# Patient Record
Sex: Female | Born: 1970 | Hispanic: Yes | Smoking: Never smoker
Health system: Southern US, Community
[De-identification: ages and names within clinical notes are randomized; demographics above are authoritative.]

## PROBLEM LIST (undated history)

## (undated) DIAGNOSIS — R51 Headache: Secondary | ICD-10-CM

## (undated) HISTORY — PX: BREAST BIOPSY: SHX20

## (undated) HISTORY — PX: NO PAST SURGERIES: SHX2092

---

## 2011-05-29 ENCOUNTER — Other Ambulatory Visit (HOSPITAL_COMMUNITY): Payer: Self-pay | Admitting: Family Medicine

## 2011-05-29 DIAGNOSIS — Z139 Encounter for screening, unspecified: Secondary | ICD-10-CM

## 2011-06-04 ENCOUNTER — Ambulatory Visit (HOSPITAL_COMMUNITY)
Admission: RE | Admit: 2011-06-04 | Discharge: 2011-06-04 | Disposition: A | Discharge status: Home / Self Care | Payer: Self-pay | Source: Ambulatory Visit | Attending: Family Medicine | Admitting: Family Medicine

## 2011-06-04 DIAGNOSIS — Z139 Encounter for screening, unspecified: Secondary | ICD-10-CM

## 2012-04-21 ENCOUNTER — Other Ambulatory Visit (HOSPITAL_COMMUNITY): Payer: Self-pay | Admitting: Nurse Practitioner

## 2012-04-21 DIAGNOSIS — N63 Unspecified lump in unspecified breast: Secondary | ICD-10-CM

## 2012-04-21 DIAGNOSIS — Z139 Encounter for screening, unspecified: Secondary | ICD-10-CM

## 2012-04-22 ENCOUNTER — Ambulatory Visit (HOSPITAL_COMMUNITY)
Admission: RE | Admit: 2012-04-22 | Discharge: 2012-04-22 | Disposition: A | Discharge status: Home / Self Care | Payer: PRIVATE HEALTH INSURANCE | Source: Ambulatory Visit | Attending: General Surgery | Admitting: General Surgery

## 2012-04-22 ENCOUNTER — Other Ambulatory Visit (HOSPITAL_COMMUNITY): Payer: Self-pay | Admitting: General Surgery

## 2012-04-22 DIAGNOSIS — N644 Mastodynia: Secondary | ICD-10-CM | POA: Insufficient documentation

## 2012-04-22 DIAGNOSIS — L02419 Cutaneous abscess of limb, unspecified: Secondary | ICD-10-CM

## 2012-04-22 DIAGNOSIS — R599 Enlarged lymph nodes, unspecified: Secondary | ICD-10-CM | POA: Insufficient documentation

## 2012-04-22 DIAGNOSIS — IMO0002 Reserved for concepts with insufficient information to code with codable children: Secondary | ICD-10-CM | POA: Insufficient documentation

## 2012-04-24 NOTE — Patient Instructions (Addendum)
20 Katherine Jensen  04/24/2012   Your procedure is scheduled on:  04/28/2012   Report to [facility] at  930  AM.  Call this number if you have problems the morning of surgery: [phone]   Remember:   Do not eat food:After Midnight.  May have clear liquids:until Midnight .    Take these medicines the morning of surgery with A SIP OF WATER: none   Do not wear jewelry, make-up or nail polish.  Do not wear lotions, powders, or perfumes. You may wear deodorant.  Do not shave 48 hours prior to surgery. Men may shave face and neck.  Do not bring valuables to the hospital.  Contacts, dentures or bridgework may not be worn into surgery.  Leave suitcase in the car. After surgery it may be brought to your room.  For patients admitted to the hospital, checkout time is 11:00 AM the day of discharge.   Patients discharged the day of surgery will not be allowed to drive home.  Name and phone number of your driver: family  Special Instructions: CHG Shower Use Special Wash: 1/2 bottle night before surgery and 1/2 bottle morning of surgery.   Please read over the following fact sheets that you were given: Pain Booklet, MRSA Information, Surgical Site Infection Prevention, Anesthesia Post-op Instructions and Care and Recovery After Surgery Swollen Lymph Nodes The lymphatic system filters fluid from around cells. It is like a system of blood vessels. These channels carry lymph instead of blood. The lymphatic system is an important part of the immune (disease fighting) system. When people talk about "swollen glands in the neck," they are usually talking about swollen lymph nodes. The lymph nodes are like the little traps for infection. You and your caregiver may be able to feel lymph nodes, especially swollen nodes, in these common areas: the groin (inguinal area), armpits (axilla), and above the clavicle (supraclavicular). You may also feel them in the neck (cervical) and the back of the head just above the  hairline (occipital). Swollen glands occur when there is any condition in which the body responds with an allergic type of reaction. For instance, the glands in the neck can become swollen from insect bites or any type of minor infection on the head. These are very noticeable in children with only minor problems. Lymph nodes may also become swollen when there is a tumor or problem with the lymphatic system, such as Hodgkin's disease. TREATMENT   Most swollen glands do not require treatment. They can be observed (watched) for a short period of time, if your caregiver feels it is necessary. Most of the time, observation is not necessary.   Antibiotics (medicines that kill germs) may be prescribed by your caregiver. Your caregiver may prescribe these if he or she feels the swollen glands are due to a bacterial (germ) infection. Antibiotics are not used if the swollen glands are caused by a virus.  HOME CARE INSTRUCTIONS   Take medications as directed by your caregiver. Only take over-the-counter or prescription medicines for pain, discomfort, or fever as directed by your caregiver.  SEEK MEDICAL CARE IF:   If you begin to run a temperature greater than 102 F (38.9 C), or as your caregiver suggests.  MAKE SURE YOU:   Understand these instructions.   Will watch your condition.   Will get help right away if you are not doing well or get worse.  Document Released: 10/12/2002 Document Revised: 10/11/2011 Document Reviewed: 10/22/2005 ExitCare Patient Information  [address], [place].PATIENT INSTRUCTIONS POST-ANESTHESIA  IMMEDIATELY FOLLOWING SURGERY:  Do not drive or operate machinery for the first twenty four hours after surgery.  Do not make any important decisions for twenty four hours after surgery or while taking narcotic pain medications or sedatives.  If you develop intractable nausea and vomiting or a severe headache please notify your doctor immediately.  FOLLOW-UP:  Please make an  appointment with your surgeon as instructed. You do not need to follow up with anesthesia unless specifically instructed to do so.  WOUND CARE INSTRUCTIONS (if applicable):  Keep a dry clean dressing on the anesthesia/puncture wound site if there is drainage.  Once the wound has quit draining you may leave it open to air.  Generally you should leave the bandage intact for twenty four hours unless there is drainage.  If the epidural site drains for more than 36-48 hours please call the anesthesia department.  QUESTIONS?:  Please feel free to call your physician or the hospital operator if you have any questions, and they will be happy to assist you.   Ganglios linfticos inflamados (Swollen Lymph Nodes) El sistema linftico filtra los lquidos que rodean a las clulas. Es similar al sistema de vasos sanguneos. Estos canales transportan linfa en vez de sangre. El sistema linftico es una parte importante del sistema inmunitario (el que lucha contra las enfermedades). Cuando las personas hablan de "glndulas inflamadas en el cuello" generalmente se refieren a la inflamacin de los ganglios linfticos. Los ganglios linfticos son como pequeas trampas para las infecciones. Usted y Mining engineer que lo asiste pueden palpar los ganglios linfticos, especialmente los que estn inflamados, en estas zonas: en la ingle, en la axila y en la zona que se encuentra por encima de la clavcula. Tambin podr palparlos en el cuello (zona cervical) y en la parte posterior de la cabeza, justo por encima de la lnea del cuero cabelludo (zona occipital). Los ganglios linfticos inflamados aparecen cuando hay alguna enfermedad en la que el organismo responde con cierto tipo de Automotive engineer. Por ejemplo, los ganglios del cuello pueden inflamarse por la picadura de un insecto o por cualquier infeccin menor en la cabeza. Estos son muy fciles de advertir en los nios que sufren un trastorno leve. Los ganglios linfticos  tambin se inflaman cuando hay un tumor o un problema con el sistema linftico, como en la enfermedad de Hodgkin. TRATAMIENTO  La mayor parte de los ganglios inflamados no requieren TEFL teacher. Tendrn que observarse durante un breve perodo, si el profesional lo considera necesario. En general, no es necesaria la observacin.   El profesional que lo asiste podr prescribirle antibiticos (medicamentos que destruyen grmenes). El profesional podr prescribirlos si considera que la inflamacin de los ganglios se debe a una infeccin bacteriana (por grmenes). Los antibiticos no se utilizan si la causa de la inflamacin es un virus.  INSTRUCCIONES PARA EL CUIDADO DOMICILIARIO  Tome los [employer] indic el profesional que lo asiste. Utilice los medicamentos de venta libre o de prescripcin para Chief Technology Officer, Environmental health practitioner o la [place], segn se lo indique el profesional que lo asiste.  SOLICITE ATENCIN MDICA SI:  La temperatura se eleva sin motivo por encima de 102 F (38.9 C) o segn le indique el profesional que lo asiste.  EST SEGURO QUE:   Comprende las instrucciones para el alta mdica.   Controlar su enfermedad.   Solicitar atencin mdica de inmediato segn las indicaciones.  Document Released: 08/01/2005 Document Revised: 10/11/2011 ExitCare Patient Information  [address]        re, [place].Instrucciones a seguir luego de la anestesia general en los adultos (Instructions Following General Anesthetic, Adult) Usted fue sometido a anestesia general. Un anestesista (un enfermero especializado en administrar anestesia) o un anestesilogo (un mdico especializado en administrar anestesia) lo ha inducido a dormir con Designer, multimedia un procedimiento. Durante las 24 horas siguientes al procedimiento podr sentir:  Research scientist (life sciences).   Debilidad.   Somnolencia.  DESPUS DE LA CIRUGA Despus de la [place], lo llevarn a una sala de recuperacin. Un  enfermero controlar su evolucin. Una vez que despierte, se encuentre estabilizado y pueda ingerir lquidos, usted podr volver a su hogar excepto que ocurra un imprevisto. La informacin que sigue es vlida para las primeras 24 horas posteriores a Higher education careers adviser.  No conduzca. Si est solo, no tome transportes pblicos.   No beba alcohol.   No tome medicamentos que no le haya autorizado el profesional que lo asiste.   No firme documentos importantes ni tome decisiones trascendentes.   Es importante que una persona responsable lo acompae durante las primeras 24 horas luego de la anestesia.   Puede reanudar su dieta y sus actividades normales segn se le haya indicado.   Utilice los medicamentos de venta libre o de prescripcin para Chief Technology Officer, Environmental health practitioner o la [place], segn se lo indique el profesional que lo asiste.  Si tiene preguntas o se le presenta algn problema relacionado con la anestesia, comunquese con el hospital y pida por el anestesista o anestesilogo de [place]. SOLICITE ATENCIN MDICA DE INMEDIATO SI:  Aparece una erupcin cutnea.   Presenta dificultad para respirar.   Siente dolor en el pecho.   Presenta algn problema de alergia.  Document Released: 10/22/2005 Document Revised: 10/11/2011 [facility] Patient Information 2012 [place], [place].

## 2012-04-25 ENCOUNTER — Encounter (HOSPITAL_COMMUNITY)
Admission: RE | Admit: 2012-04-25 | Discharge: 2012-04-25 | Disposition: A | Discharge status: Home / Self Care | Payer: Self-pay | Source: Ambulatory Visit | Attending: General Surgery | Admitting: General Surgery

## 2012-04-25 ENCOUNTER — Encounter (HOSPITAL_COMMUNITY): Payer: Self-pay | Admitting: Pharmacy Technician

## 2012-04-25 ENCOUNTER — Encounter (HOSPITAL_COMMUNITY): Payer: Self-pay

## 2012-04-25 HISTORY — DX: Headache: R51

## 2012-04-25 LAB — BASIC METABOLIC PANEL
CO2: 30 mEq/L (ref 19–32)
Chloride: 100 mEq/L (ref 96–112)
Sodium: 137 mEq/L (ref 135–145)

## 2012-04-25 LAB — SURGICAL PCR SCREEN: Staphylococcus aureus: NEGATIVE

## 2012-04-25 LAB — HCG, SERUM, QUALITATIVE: Preg, Serum: NEGATIVE

## 2012-04-28 ENCOUNTER — Encounter (HOSPITAL_COMMUNITY): Payer: Self-pay | Admitting: Anesthesiology

## 2012-04-28 ENCOUNTER — Encounter (HOSPITAL_COMMUNITY)
Admission: RE | Disposition: A | Discharge status: Home / Self Care | Payer: Self-pay | Source: Ambulatory Visit | Attending: General Surgery

## 2012-04-28 ENCOUNTER — Ambulatory Visit (HOSPITAL_COMMUNITY): Payer: PRIVATE HEALTH INSURANCE | Admitting: Anesthesiology

## 2012-04-28 ENCOUNTER — Ambulatory Visit (HOSPITAL_COMMUNITY)
Admission: RE | Admit: 2012-04-28 | Discharge: 2012-04-28 | Disposition: A | Discharge status: Home / Self Care | Payer: PRIVATE HEALTH INSURANCE | Source: Ambulatory Visit | Attending: General Surgery | Admitting: General Surgery

## 2012-04-28 DIAGNOSIS — R599 Enlarged lymph nodes, unspecified: Secondary | ICD-10-CM

## 2012-04-28 DIAGNOSIS — I889 Nonspecific lymphadenitis, unspecified: Secondary | ICD-10-CM | POA: Insufficient documentation

## 2012-04-28 HISTORY — PX: MASS EXCISION: SHX2000

## 2012-04-28 SURGERY — AXILLARY LYMPH NODE BIOPSY
Anesthesia: General | Site: Axilla | Laterality: Left | Wound class: Clean

## 2012-04-28 MED ORDER — CEFAZOLIN SODIUM 1-5 GM-% IV SOLN
1.0000 g | INTRAVENOUS | Status: AC
  Administered 2012-04-28: 1 g via INTRAVENOUS

## 2012-04-28 MED ORDER — LIDOCAINE HCL (PF) 1 % IJ SOLN
INTRAMUSCULAR | Status: AC
  Filled 2012-04-28: qty 5

## 2012-04-28 MED ORDER — PROPOFOL 10 MG/ML IV EMUL
INTRAVENOUS | Status: AC
  Filled 2012-04-28: qty 20

## 2012-04-28 MED ORDER — MIDAZOLAM HCL 2 MG/2ML IJ SOLN
1.0000 mg | INTRAMUSCULAR | Status: DC | PRN
  Administered 2012-04-28: 2 mg via INTRAVENOUS

## 2012-04-28 MED ORDER — STERILE WATER FOR IRRIGATION IR SOLN
Status: DC | PRN
  Administered 2012-04-28: 2000 mL

## 2012-04-28 MED ORDER — MIDAZOLAM HCL 2 MG/2ML IJ SOLN
INTRAMUSCULAR | Status: AC
  Administered 2012-04-28: 2 mg via INTRAVENOUS
  Filled 2012-04-28: qty 2

## 2012-04-28 MED ORDER — FENTANYL CITRATE 0.05 MG/ML IJ SOLN
INTRAMUSCULAR | Status: AC
  Administered 2012-04-28: 50 ug via INTRAVENOUS
  Filled 2012-04-28: qty 2

## 2012-04-28 MED ORDER — BACITRACIN ZINC 500 UNIT/GM EX OINT
TOPICAL_OINTMENT | CUTANEOUS | Status: AC
  Filled 2012-04-28: qty 1.8

## 2012-04-28 MED ORDER — EPHEDRINE SULFATE 50 MG/ML IJ SOLN
INTRAMUSCULAR | Status: AC
  Filled 2012-04-28: qty 1

## 2012-04-28 MED ORDER — OXYCODONE-ACETAMINOPHEN 5-325 MG PO TABS: 1.0000 | ORAL_TABLET | ORAL | Status: AC | PRN

## 2012-04-28 MED ORDER — CEFAZOLIN SODIUM 1-5 GM-% IV SOLN
INTRAVENOUS | Status: AC
  Filled 2012-04-28: qty 50

## 2012-04-28 MED ORDER — HEMOSTATIC AGENTS (NO CHARGE) OPTIME
TOPICAL | Status: DC | PRN
  Administered 2012-04-28: 1 via TOPICAL

## 2012-04-28 MED ORDER — PROPOFOL 10 MG/ML IV BOLUS
INTRAVENOUS | Status: DC | PRN
  Administered 2012-04-28: 30 mg via INTRAVENOUS
  Administered 2012-04-28: 120 mg via INTRAVENOUS

## 2012-04-28 MED ORDER — STERILE WATER FOR IRRIGATION IR SOLN
Status: DC | PRN
  Administered 2012-04-28: 1000 mL

## 2012-04-28 MED ORDER — ONDANSETRON HCL 4 MG/2ML IJ SOLN: 4.0000 mg | Freq: Once | INTRAMUSCULAR | Status: DC | PRN

## 2012-04-28 MED ORDER — FENTANYL CITRATE 0.05 MG/ML IJ SOLN
INTRAMUSCULAR | Status: DC | PRN
  Administered 2012-04-28: 25 ug via INTRAVENOUS
  Administered 2012-04-28: 50 ug via INTRAVENOUS
  Administered 2012-04-28: 25 ug via INTRAVENOUS
  Administered 2012-04-28: 50 ug via INTRAVENOUS

## 2012-04-28 MED ORDER — LIDOCAINE HCL 1 % IJ SOLN
INTRAMUSCULAR | Status: DC | PRN
  Administered 2012-04-28: 30 mg via INTRADERMAL

## 2012-04-28 MED ORDER — LACTATED RINGERS IV SOLN
INTRAVENOUS | Status: DC
  Administered 2012-04-28: 11:00:00 via INTRAVENOUS

## 2012-04-28 MED ORDER — EPHEDRINE SULFATE 50 MG/ML IJ SOLN
INTRAMUSCULAR | Status: DC | PRN
  Administered 2012-04-28 (×2): 5 mg via INTRAVENOUS

## 2012-04-28 MED ORDER — FENTANYL CITRATE 0.05 MG/ML IJ SOLN
INTRAMUSCULAR | Status: AC
  Filled 2012-04-28: qty 2

## 2012-04-28 MED ORDER — FENTANYL CITRATE 0.05 MG/ML IJ SOLN
25.0000 ug | INTRAMUSCULAR | Status: DC | PRN
  Administered 2012-04-28: 50 ug via INTRAVENOUS

## 2012-04-28 SURGICAL SUPPLY — 52 items
APPLIER CLIP 11 MED OPEN (CLIP)
APPLIER CLIP 9.375 SM OPEN (CLIP) ×4
ATTRACTOMAT 16X20 MAGNETIC DRP (DRAPES) ×2 IMPLANT
BAG HAMPER (MISCELLANEOUS) ×2 IMPLANT
BLADE SURG SZ10 CARB STEEL (BLADE) ×2 IMPLANT
BNDG CONFORM 6X.82 1P STRL (GAUZE/BANDAGES/DRESSINGS) IMPLANT
CLIP APPLIE 11 MED OPEN (CLIP) IMPLANT
CLIP APPLIE 9.375 SM OPEN (CLIP) ×2 IMPLANT
CLOTH BEACON ORANGE TIMEOUT ST (SAFETY) ×2 IMPLANT
COVER LIGHT HANDLE STERIS (MISCELLANEOUS) ×4 IMPLANT
DRAPE PROXIMA HALF (DRAPES) ×2 IMPLANT
ELECT REM PT RETURN 9FT ADLT (ELECTROSURGICAL) ×2
ELECTRODE REM PT RTRN 9FT ADLT (ELECTROSURGICAL) ×1 IMPLANT
EVACUATOR DRAINAGE 10X20 100CC (DRAIN) IMPLANT
EVACUATOR SILICONE 100CC (DRAIN)
FORMALIN 10 PREFIL 480ML (MISCELLANEOUS) IMPLANT
GAUZE PACKING IODOFORM 1 (PACKING) ×2 IMPLANT
GLOVE BIOGEL PI IND STRL 7.0 (GLOVE) ×2 IMPLANT
GLOVE BIOGEL PI INDICATOR 7.0 (GLOVE) ×2
GLOVE ECLIPSE 6.5 STRL STRAW (GLOVE) ×2 IMPLANT
GLOVE ECLIPSE 7.0 STRL STRAW (GLOVE) ×2 IMPLANT
GLOVE EXAM NITRILE MD LF STRL (GLOVE) ×4 IMPLANT
GLOVE SKINSENSE NS SZ7.0 (GLOVE) ×1
GLOVE SKINSENSE STRL SZ7.0 (GLOVE) ×1 IMPLANT
GOWN STRL REIN XL XLG (GOWN DISPOSABLE) ×6 IMPLANT
INST SET MINOR GENERAL (KITS) ×2 IMPLANT
KIT ROOM TURNOVER APOR (KITS) ×2 IMPLANT
MANIFOLD NEPTUNE II (INSTRUMENTS) ×2 IMPLANT
MARKER SKIN DUAL TIP RULER LAB (MISCELLANEOUS) ×2 IMPLANT
PACK MINOR (CUSTOM PROCEDURE TRAY) ×2 IMPLANT
PAD ABD 5X9 TENDERSORB (GAUZE/BANDAGES/DRESSINGS) ×2 IMPLANT
PAD ARMBOARD 7.5X6 YLW CONV (MISCELLANEOUS) ×2 IMPLANT
SET BASIN LINEN APH (SET/KITS/TRAYS/PACK) ×2 IMPLANT
SOL PREP PROV IODINE SCRUB 4OZ (MISCELLANEOUS) ×2 IMPLANT
SPONGE GAUZE 4X4 12PLY (GAUZE/BANDAGES/DRESSINGS) ×2 IMPLANT
SPONGE INTESTINAL PEANUT (DISPOSABLE) ×4 IMPLANT
SPONGE LAP 18X18 X RAY DECT (DISPOSABLE) ×2 IMPLANT
STAPLER VISISTAT 35W (STAPLE) ×2 IMPLANT
STOCKINETTE IMPERVIOUS LG (DRAPES) ×2 IMPLANT
SUT ETHILON 3 0 FSL (SUTURE) ×4 IMPLANT
SUT SILK 2 0 (SUTURE) ×1
SUT SILK 2-0 18XBRD TIE 12 (SUTURE) ×1 IMPLANT
SUT VIC AB 3-0 SH 27 (SUTURE) ×2
SUT VIC AB 3-0 SH 27X BRD (SUTURE) ×2 IMPLANT
SUT VIC AB 5-0 P-3 18X BRD (SUTURE) IMPLANT
SUT VIC AB 5-0 P3 18 (SUTURE)
SUT VICRYL AB 3 0 TIES (SUTURE) ×2 IMPLANT
SWAB CULTURE LIQ STUART DBL (MISCELLANEOUS) ×2 IMPLANT
SYR BULB IRRIGATION 50ML (SYRINGE) ×4 IMPLANT
TAPE CLOTH SURG 4X10 WHT LF (GAUZE/BANDAGES/DRESSINGS) ×2 IMPLANT
TUBE ANAEROBIC PORT A CUL  W/M (MISCELLANEOUS) ×2 IMPLANT
WATER STERILE IRR 1000ML POUR (IV SOLUTION) ×6 IMPLANT

## 2012-04-28 NOTE — Anesthesia Postprocedure Evaluation (Addendum)
  Anesthesia Post-op Note  Patient: Katherine Jensen  Procedure(s) Performed: Procedure(s) (LRB): AXILLARY LYMPH NODE BIOPSY (Left) EXCISION MASS (Left)  Patient Location: PACU  Anesthesia Type: General  Level of Consciousness: sedated  Airway and Oxygen Therapy: Patient Spontanous Breathing and Patient connected to face mask oxygen  Post-op Pain: none  Post-op Assessment: Post-op Vital signs reviewed, Patient's Cardiovascular Status Stable, Respiratory Function Stable, Patent Airway and No signs of Nausea or vomiting  Post-op Vital Signs: Reviewed and stable  Complications: No apparent anesthesia complications

## 2012-04-28 NOTE — Anesthesia Preprocedure Evaluation (Addendum)
Anesthesia Evaluation  Patient identified by MRN, date of birth, ID band Patient awake    Reviewed: Allergy & Precautions, H&P , NPO status   Airway   Neck ROM: Full    Dental  (+) Partial Upper   Pulmonary neg pulmonary ROS,  breath sounds clear to auscultation        Cardiovascular negative cardio ROS  Rhythm:Regular     Neuro/Psych negative neurological ROS     GI/Hepatic   Endo/Other    Renal/GU      Musculoskeletal   Abdominal   Peds  Hematology   Anesthesia Other Findings   Reproductive/Obstetrics                           Anesthesia Physical Anesthesia Plan  ASA: I  Anesthesia Plan: General   Post-op Pain Management:    Induction: Intravenous  Airway Management Planned: LMA  Additional Equipment:   Intra-op Plan:   Post-operative Plan: Extubation in OR  Informed Consent: I have reviewed the patients History and Physical, chart, labs and discussed the procedure including the risks, benefits and alternatives for the proposed anesthesia with the patient or authorized representative who has indicated his/her understanding and acceptance.     Plan Discussed with:   Anesthesia Plan Comments:         Anesthesia Quick Evaluation

## 2012-04-28 NOTE — Discharge Instructions (Signed)
Breast Biopsy Care After These instructions give you information on caring for yourself after your procedure. Your doctor may also give you more specific instructions. Call your doctor if you have any problems or questions after your procedure. HOME CARE  Change any bandages (dressings) as told by your doctor.   Only take sponge baths during the first 2 to 3 days. Keep your bandage dry.   Wear a bra all day and night until you see your doctor again.   Eat a healthy diet.   Do not exercise, drive, lift, or do activities without your doctor's permission.   Only take medicine as told by your doctor. Do not take aspirin. Do not drink alcohol while taking pain medicine.   Protect the biopsy area. Do not let the area get bumped.   Keep all follow-up appointments.  Finding out the results of your test Ask when your test results will be ready. Make sure you get your test results. GET HELP RIGHT AWAY IF:   You have trouble breathing.   You have redness and puffiness (swelling) around the biopsy site.   You notice a bad smell coming from the biopsy site.   You are bleeding more from your biopsy site.   You have yellowish white fluid (pus) coming from the biopsy site.   The biopsy site is opening.   You get a rash.   You need stronger pain medicine.   You think you are reacting badly to your medicine.   You have a fever.  MAKE SURE YOU:  Understand these instructions.   Will watch your condition.   Will get help right away if you are not doing well or get worse.  Document Released: 08/18/2009 Document Revised: 10/11/2011 Document Reviewed: 08/18/2009 ExitCare Patient Information 2012 [employer]. 

## 2012-04-28 NOTE — Progress Notes (Signed)
41 yr old [place]. Female with tender node in Left axilla for biopsy.  Mammogram neg.  Clinically no findings suggestive of abscess other than pain and mildly elevated  White count.  Sonogram shows enlarged lymph nodes.  She states she has had no rashes or fatigue or wt loss.  She may have had some temp but this has never been recorded.  Have discussed with path and we will deliver specimen in saline soaked gauze.  Labs reviewed and procedure and risks explained in Spanish.  Operative site marked  No clinical change in H&P.

## 2012-04-28 NOTE — Op Note (Signed)
Katherine Jensen, Katherine Jensen                 ACCOUNT NO.:  [id number]  MEDICAL RECORD NO.:  [id number]  LOCATION:  [facility]                          FACILITY:  [facility]  PHYSICIAN:  Barbaraann Barthel, M.D. DATE OF BIRTH:  Aug 26, 1971  DATE OF PROCEDURE:  04/28/2012 DATE OF DISCHARGE:                              OPERATIVE REPORT   PREOPERATIVE DIAGNOSIS:  Enlarged axillary lymph node.  POSTOPERATIVE DIAGNOSIS:  Enlarged axillary lymph node.  SURGEON:  Barbaraann Barthel, MD  SPECIMEN:  Left axillary lymph node.  WOUND CLASSIFICATION:  Clean contaminated (possible abscess versus necrotic lymph node).  Final pathology pending.  NOTE:  This is a 41 year old [place] female who had an enlarged left axillary lymph node.  Her breast exam was negative.  This year's mammogram was also negative.  We did not find any other findings on her head and neck exam.  Her clinical symptoms, she states she may have had some temperature; however, each time we took her temperature, it was no higher than 98.0 essentially.  She had no history of any Pel-Ebstein type of fevers, although she had considerable pain in her left axilla. Examining her left axilla, she did not have any erythema.  Other than pain, she had no obvious signs of any abscess.  The sonogram revealed an enlarged lymph nodes in the left axilla and no fluid collection suggesting abscess.  GROSS OPERATIVE FINDINGS:  There appeared to be there some necrotic tissue versus pus.  I did not smell any fraction.  Cultures were obtained for aerobic and anaerobic and acid-fast cultures.  The lymph node was sent to Pathology in saline for examination.  Final pathology is pending.  TECHNIQUE:  The patient was placed was in the supine position, slightly rolled to the right side of the bed.  Her underarm was prepped with Betadine solution and draped in the usual manner.  There was a very hard lymph node approximately 3 cm in diameter.  This was marked with  a sterile marking pen and then an incision was carried out over this area and we dissected this free.  There were small vessels which were clipped with surgical clips and larger vessels were tied off with a 3-0 monofilament suture.  We then irrigated with normal saline solution and sent this for frozen section which is still pending at the time of this dictation.  The wound was irrigated copiously.  We then laid a piece of Surgicel over the bed of this axilla and then packed the wound open with a saline soaked iodoform gauze which was cut to about approximately 0.25 inch in diameter.  This staples were loosely approximated the wound with the packing in between them.  Sterile dressing was applied.  Prior to closure, all sponge, needle, and instrument counts were found to be correct.  Estimated blood loss was minimal.  The patient tolerated the procedure well and was taken to the recovery room in satisfactory condition.     Barbaraann Barthel, M.D.     WB/MEDQ  D:  04/28/2012  T:  04/28/2012  Job:  [id number]

## 2012-04-28 NOTE — Progress Notes (Signed)
Post OP Check  Wound clean and dry.  Min. Discomfort.  Final path pending.  Discharge and follow up explained in Spanish.  Filed Vitals:   04/28/12 1215  BP: 106/61  Pulse:   Temp:   Resp: 14  HR 67, temp 97.8,

## 2012-04-28 NOTE — Brief Op Note (Signed)
04/28/2012  12:11 PM  PATIENT:  Katherine Jensen  41 y.o. female  PRE-OPERATIVE DIAGNOSIS:  enlarged left axillary lymph node  POST-OPERATIVE DIAGNOSIS:  hypertrophied left axillary lymph node, final pathology pending  PROCEDURE:  Procedure(s) (LRB): AXILLARY LYMPH NODE BIOPSY (Left) EXCISION MASS (Left)  SURGEON:  Surgeon(s) and Role:    * Marlane Hatcher, MD - Primary  PHYSICIAN ASSISTANT:   ASSISTANTS: none   ANESTHESIA:   general  EBL:  Total I/O In: 300 [I.V.:300] Out: 20 [Blood:20]  BLOOD ADMINISTERED:none  DRAINS: none   LOCAL MEDICATIONS USED:  NONE  SPECIMEN:  Source of Specimen:  left axillary node, and cultures taken  DISPOSITION OF SPECIMEN:  PATHOLOGY  COUNTS:  YES  TOURNIQUET:  * No tourniquets in log *  DICTATION: .Other Dictation: Dictation Number OR dict.# [id number].  PLAN OF CARE: Discharge to home after PACU  PATIENT DISPOSITION:  PACU - hemodynamically stable.   Delay start of Pharmacological VTE agent (>24hrs) due to surgical blood loss or risk of bleeding: not applicable

## 2012-04-28 NOTE — Transfer of Care (Addendum)
Immediate Anesthesia Transfer of Care Note  Patient: Katherine Jensen  Procedure(s) Performed: Procedure(s) (LRB): AXILLARY LYMPH NODE BIOPSY (Left) EXCISION MASS (Left)  Patient Location: PACU  Anesthesia Type: General  Level of Consciousness: sedated  Airway & Oxygen Therapy: Patient Spontanous Breathing and Patient connected to face mask oxygen  Post-op Assessment: Report given to PACU RN  Post vital signs: Reviewed and stable  Complications: No apparent anesthesia complications

## 2012-04-28 NOTE — Addendum Note (Signed)
Addendum  created 04/28/12 1308 by Moshe Salisbury, CRNA   Modules edited:Charges VN

## 2012-04-28 NOTE — Anesthesia Procedure Notes (Signed)
Procedure Name: LMA Insertion Date/Time: 04/28/2012 10:54 AM Performed by: Glynn Octave E Pre-anesthesia Checklist: Patient identified, Patient being monitored, Emergency Drugs available, Timeout performed and Suction available Patient Re-evaluated:Patient Re-evaluated prior to inductionOxygen Delivery Method: Circle System Utilized Preoxygenation: Pre-oxygenation with 100% oxygen Intubation Type: IV induction Ventilation: Mask ventilation without difficulty LMA: LMA inserted LMA Size: 4.0 and 3.0 Number of attempts: 1 Placement Confirmation: positive ETCO2 and breath sounds checked- equal and bilateral

## 2012-04-29 NOTE — Addendum Note (Signed)
Addendum  created 04/29/12 1109 by Lillyann Ahart J Haifa Hatton, CRNA   Modules edited:Notes Section    

## 2012-04-29 NOTE — Anesthesia Postprocedure Evaluation (Signed)
  Anesthesia Post-op Note  Patient: Katherine Jensen  Procedure(s) Performed: Procedure(s) (LRB): AXILLARY LYMPH NODE BIOPSY (Left) EXCISION MASS (Left)  Patient Location: room 3a  Anesthesia Type: General  Level of Consciousness: awake, alert , oriented and patient cooperative  Airway and Oxygen Therapy: Patient Spontanous Breathing  Post-op Pain: none  Post-op Assessment: Post-op Vital signs reviewed, Patient's Cardiovascular Status Stable, Respiratory Function Stable, Patent Airway, No signs of Nausea or vomiting and Pain level controlled  Post-op Vital Signs: Reviewed and stable  Complications: No apparent anesthesia complications

## 2012-04-30 ENCOUNTER — Encounter (HOSPITAL_COMMUNITY): Payer: Self-pay | Admitting: General Surgery

## 2012-05-01 LAB — WOUND CULTURE

## 2012-05-03 LAB — ANAEROBIC CULTURE

## 2012-05-07 ENCOUNTER — Encounter (HOSPITAL_COMMUNITY): Payer: Self-pay

## 2012-05-26 LAB — FUNGUS CULTURE W SMEAR

## 2012-06-11 LAB — AFB CULTURE WITH SMEAR (NOT AT ARMC): Acid Fast Smear: NONE SEEN

## 2012-06-30 ENCOUNTER — Ambulatory Visit (HOSPITAL_COMMUNITY): Payer: PRIVATE HEALTH INSURANCE

## 2012-07-14 ENCOUNTER — Ambulatory Visit (HOSPITAL_COMMUNITY)
Admission: RE | Admit: 2012-07-14 | Discharge: 2012-07-14 | Disposition: A | Discharge status: Home / Self Care | Payer: PRIVATE HEALTH INSURANCE | Source: Ambulatory Visit | Attending: Nurse Practitioner | Admitting: Nurse Practitioner

## 2012-07-14 DIAGNOSIS — Z139 Encounter for screening, unspecified: Secondary | ICD-10-CM

## 2012-07-14 DIAGNOSIS — Z1231 Encounter for screening mammogram for malignant neoplasm of breast: Secondary | ICD-10-CM | POA: Insufficient documentation

## 2013-07-17 ENCOUNTER — Other Ambulatory Visit (HOSPITAL_COMMUNITY): Payer: Self-pay | Admitting: Nurse Practitioner

## 2013-07-17 DIAGNOSIS — Z139 Encounter for screening, unspecified: Secondary | ICD-10-CM

## 2013-07-24 ENCOUNTER — Ambulatory Visit (HOSPITAL_COMMUNITY): Payer: Self-pay

## 2016-11-26 ENCOUNTER — Other Ambulatory Visit (HOSPITAL_COMMUNITY): Payer: Self-pay | Admitting: Nurse Practitioner

## 2016-11-26 DIAGNOSIS — Z1231 Encounter for screening mammogram for malignant neoplasm of breast: Secondary | ICD-10-CM

## 2016-11-27 ENCOUNTER — Other Ambulatory Visit (HOSPITAL_COMMUNITY): Payer: Self-pay | Admitting: Nurse Practitioner

## 2016-11-27 DIAGNOSIS — R102 Pelvic and perineal pain: Secondary | ICD-10-CM

## 2016-11-27 DIAGNOSIS — Z30431 Encounter for routine checking of intrauterine contraceptive device: Secondary | ICD-10-CM

## 2016-12-03 ENCOUNTER — Ambulatory Visit (HOSPITAL_COMMUNITY)
Admission: RE | Admit: 2016-12-03 | Discharge: 2016-12-03 | Disposition: A | Discharge status: Home / Self Care | Payer: Self-pay | Source: Ambulatory Visit | Attending: Nurse Practitioner | Admitting: Nurse Practitioner

## 2016-12-03 DIAGNOSIS — Z975 Presence of (intrauterine) contraceptive device: Secondary | ICD-10-CM | POA: Insufficient documentation

## 2016-12-03 DIAGNOSIS — R102 Pelvic and perineal pain: Secondary | ICD-10-CM

## 2016-12-03 DIAGNOSIS — Z30431 Encounter for routine checking of intrauterine contraceptive device: Secondary | ICD-10-CM

## 2016-12-10 ENCOUNTER — Ambulatory Visit (HOSPITAL_COMMUNITY)
Admission: RE | Admit: 2016-12-10 | Discharge: 2016-12-10 | Disposition: A | Discharge status: Home / Self Care | Payer: PRIVATE HEALTH INSURANCE | Source: Ambulatory Visit | Attending: Nurse Practitioner | Admitting: Nurse Practitioner

## 2016-12-10 ENCOUNTER — Encounter (HOSPITAL_COMMUNITY): Payer: Self-pay

## 2016-12-10 DIAGNOSIS — Z1231 Encounter for screening mammogram for malignant neoplasm of breast: Secondary | ICD-10-CM

## 2018-01-30 ENCOUNTER — Other Ambulatory Visit: Payer: Self-pay | Admitting: Nurse Practitioner

## 2018-01-30 DIAGNOSIS — Z1231 Encounter for screening mammogram for malignant neoplasm of breast: Secondary | ICD-10-CM

## 2019-11-07 ENCOUNTER — Emergency Department (HOSPITAL_COMMUNITY)
Admission: EM | Admit: 2019-11-07 | Discharge: 2019-11-08 | Disposition: A | Discharge status: Home / Self Care | Payer: PRIVATE HEALTH INSURANCE | Attending: Emergency Medicine | Admitting: Emergency Medicine

## 2019-11-07 ENCOUNTER — Emergency Department (HOSPITAL_COMMUNITY): Payer: PRIVATE HEALTH INSURANCE

## 2019-11-07 ENCOUNTER — Encounter (HOSPITAL_COMMUNITY): Payer: Self-pay

## 2019-11-07 ENCOUNTER — Other Ambulatory Visit: Payer: Self-pay

## 2019-11-07 DIAGNOSIS — R0602 Shortness of breath: Secondary | ICD-10-CM | POA: Insufficient documentation

## 2019-11-07 DIAGNOSIS — R0789 Other chest pain: Secondary | ICD-10-CM | POA: Insufficient documentation

## 2019-11-07 DIAGNOSIS — M549 Dorsalgia, unspecified: Secondary | ICD-10-CM | POA: Insufficient documentation

## 2019-11-07 LAB — CBC
HCT: 38 % (ref 36.0–46.0)
Hemoglobin: 12.8 g/dL (ref 12.0–15.0)
MCH: 31.4 pg (ref 26.0–34.0)
MCHC: 33.7 g/dL (ref 30.0–36.0)
MCV: 93.4 fL (ref 80.0–100.0)
Platelets: 339 10*3/uL (ref 150–400)
RBC: 4.07 MIL/uL (ref 3.87–5.11)
RDW: 12.9 % (ref 11.5–15.5)
WBC: 6.8 10*3/uL (ref 4.0–10.5)
nRBC: 0 % (ref 0.0–0.2)

## 2019-11-07 LAB — BASIC METABOLIC PANEL
Anion gap: 5 (ref 5–15)
BUN: 18 mg/dL (ref 6–20)
CO2: 24 mmol/L (ref 22–32)
Calcium: 9.1 mg/dL (ref 8.9–10.3)
Chloride: 105 mmol/L (ref 98–111)
Creatinine, Ser: 0.87 mg/dL (ref 0.44–1.00)
GFR calc Af Amer: >60 mL/min (ref >60)
GFR calc non Af Amer: >60 mL/min (ref >60)
Glucose, Bld: 104 mg/dL — ABNORMAL HIGH (ref 70–99)
Potassium: 4 mmol/L (ref 3.5–5.1)
Sodium: 134 mmol/L — ABNORMAL LOW (ref 135–145)

## 2019-11-07 LAB — TROPONIN I (HIGH SENSITIVITY): Troponin I (High Sensitivity): 4 ng/L (ref <18)

## 2019-11-07 LAB — I-STAT BETA HCG BLOOD, ED (MC, WL, AP ONLY): I-stat hCG, quantitative: <5 m[IU]/mL (ref <5)

## 2019-11-07 MED ORDER — SODIUM CHLORIDE 0.9% FLUSH: 3.0000 mL | Freq: Once | INTRAVENOUS | Status: DC
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
## 2019-11-08 LAB — TROPONIN I (HIGH SENSITIVITY): Troponin I (High Sensitivity): 4 ng/L (ref <18)

## 2019-11-08 MED ORDER — METHOCARBAMOL 500 MG PO TABS: 500.0000 mg | ORAL_TABLET | Freq: Two times a day (BID) | ORAL | 0 refills | Status: AC

## 2019-11-08 MED ORDER — IBUPROFEN 400 MG PO TABS
600.0000 mg | ORAL_TABLET | Freq: Once | ORAL | Status: AC
  Administered 2019-11-08: 600 mg via ORAL
  Filled 2019-11-08: qty 1

## 2019-11-08 MED ORDER — LIDOCAINE 5 % EX PTCH: 1.0000 | MEDICATED_PATCH | CUTANEOUS | 0 refills | Status: AC
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
## 2019-11-08 NOTE — Discharge Instructions (Addendum)

## 2020-03-16 ENCOUNTER — Other Ambulatory Visit (HOSPITAL_COMMUNITY): Payer: Self-pay | Admitting: Nurse Practitioner

## 2020-03-16 DIAGNOSIS — Z1231 Encounter for screening mammogram for malignant neoplasm of breast: Secondary | ICD-10-CM

## 2020-03-28 ENCOUNTER — Ambulatory Visit (HOSPITAL_COMMUNITY)
Admission: RE | Admit: 2020-03-28 | Discharge: 2020-03-28 | Disposition: A | Discharge status: Home / Self Care | Payer: Self-pay | Source: Ambulatory Visit | Attending: Nurse Practitioner | Admitting: Nurse Practitioner

## 2020-03-28 ENCOUNTER — Other Ambulatory Visit (HOSPITAL_COMMUNITY): Payer: Self-pay | Admitting: Nurse Practitioner

## 2020-03-28 ENCOUNTER — Other Ambulatory Visit: Payer: Self-pay

## 2020-03-28 DIAGNOSIS — Z1231 Encounter for screening mammogram for malignant neoplasm of breast: Secondary | ICD-10-CM
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
